# Patient Record
Sex: Female | Born: 1970 | Race: White | Hispanic: No | Marital: Single | Smoking: Never smoker
Health system: Southern US, Community
[De-identification: ages and names within clinical notes are randomized; demographics above are authoritative.]

## PROBLEM LIST (undated history)

## (undated) DIAGNOSIS — T7840XA Allergy, unspecified, initial encounter: Secondary | ICD-10-CM

## (undated) DIAGNOSIS — J302 Other seasonal allergic rhinitis: Secondary | ICD-10-CM

## (undated) DIAGNOSIS — K219 Gastro-esophageal reflux disease without esophagitis: Secondary | ICD-10-CM

## (undated) DIAGNOSIS — J45909 Unspecified asthma, uncomplicated: Secondary | ICD-10-CM

## (undated) DIAGNOSIS — N83209 Unspecified ovarian cyst, unspecified side: Secondary | ICD-10-CM

## (undated) HISTORY — DX: Allergy, unspecified, initial encounter: T78.40XA

## (undated) HISTORY — DX: Gastro-esophageal reflux disease without esophagitis: K21.9

## (undated) HISTORY — DX: Unspecified ovarian cyst, unspecified side: N83.209

---

## 1999-04-23 ENCOUNTER — Other Ambulatory Visit: Admission: RE | Admit: 1999-04-23 | Discharge: 1999-04-23 | Payer: Self-pay | Admitting: Obstetrics and Gynecology

## 2000-08-14 ENCOUNTER — Encounter: Admission: RE | Admit: 2000-08-14 | Discharge: 2000-08-14 | Payer: Self-pay | Admitting: Family Medicine

## 2000-08-14 ENCOUNTER — Encounter: Payer: Self-pay | Admitting: Family Medicine

## 2004-03-22 ENCOUNTER — Other Ambulatory Visit: Admission: RE | Admit: 2004-03-22 | Discharge: 2004-03-22 | Payer: Self-pay | Admitting: Family Medicine

## 2005-07-31 ENCOUNTER — Encounter: Admission: RE | Admit: 2005-07-31 | Discharge: 2005-07-31 | Payer: Self-pay | Admitting: Family Medicine

## 2006-02-07 ENCOUNTER — Other Ambulatory Visit: Admission: RE | Admit: 2006-02-07 | Discharge: 2006-02-07 | Payer: Self-pay | Admitting: Obstetrics & Gynecology

## 2006-06-10 IMAGING — CR DG CHEST 2V
2 series · 2 of 2 positions shown · non-contrast
Comparison: None.

CLINICAL DATA: Dyspnea and cough.  History of asthma. 
 PA AND LATERAL CHEST:

[w chest pa]
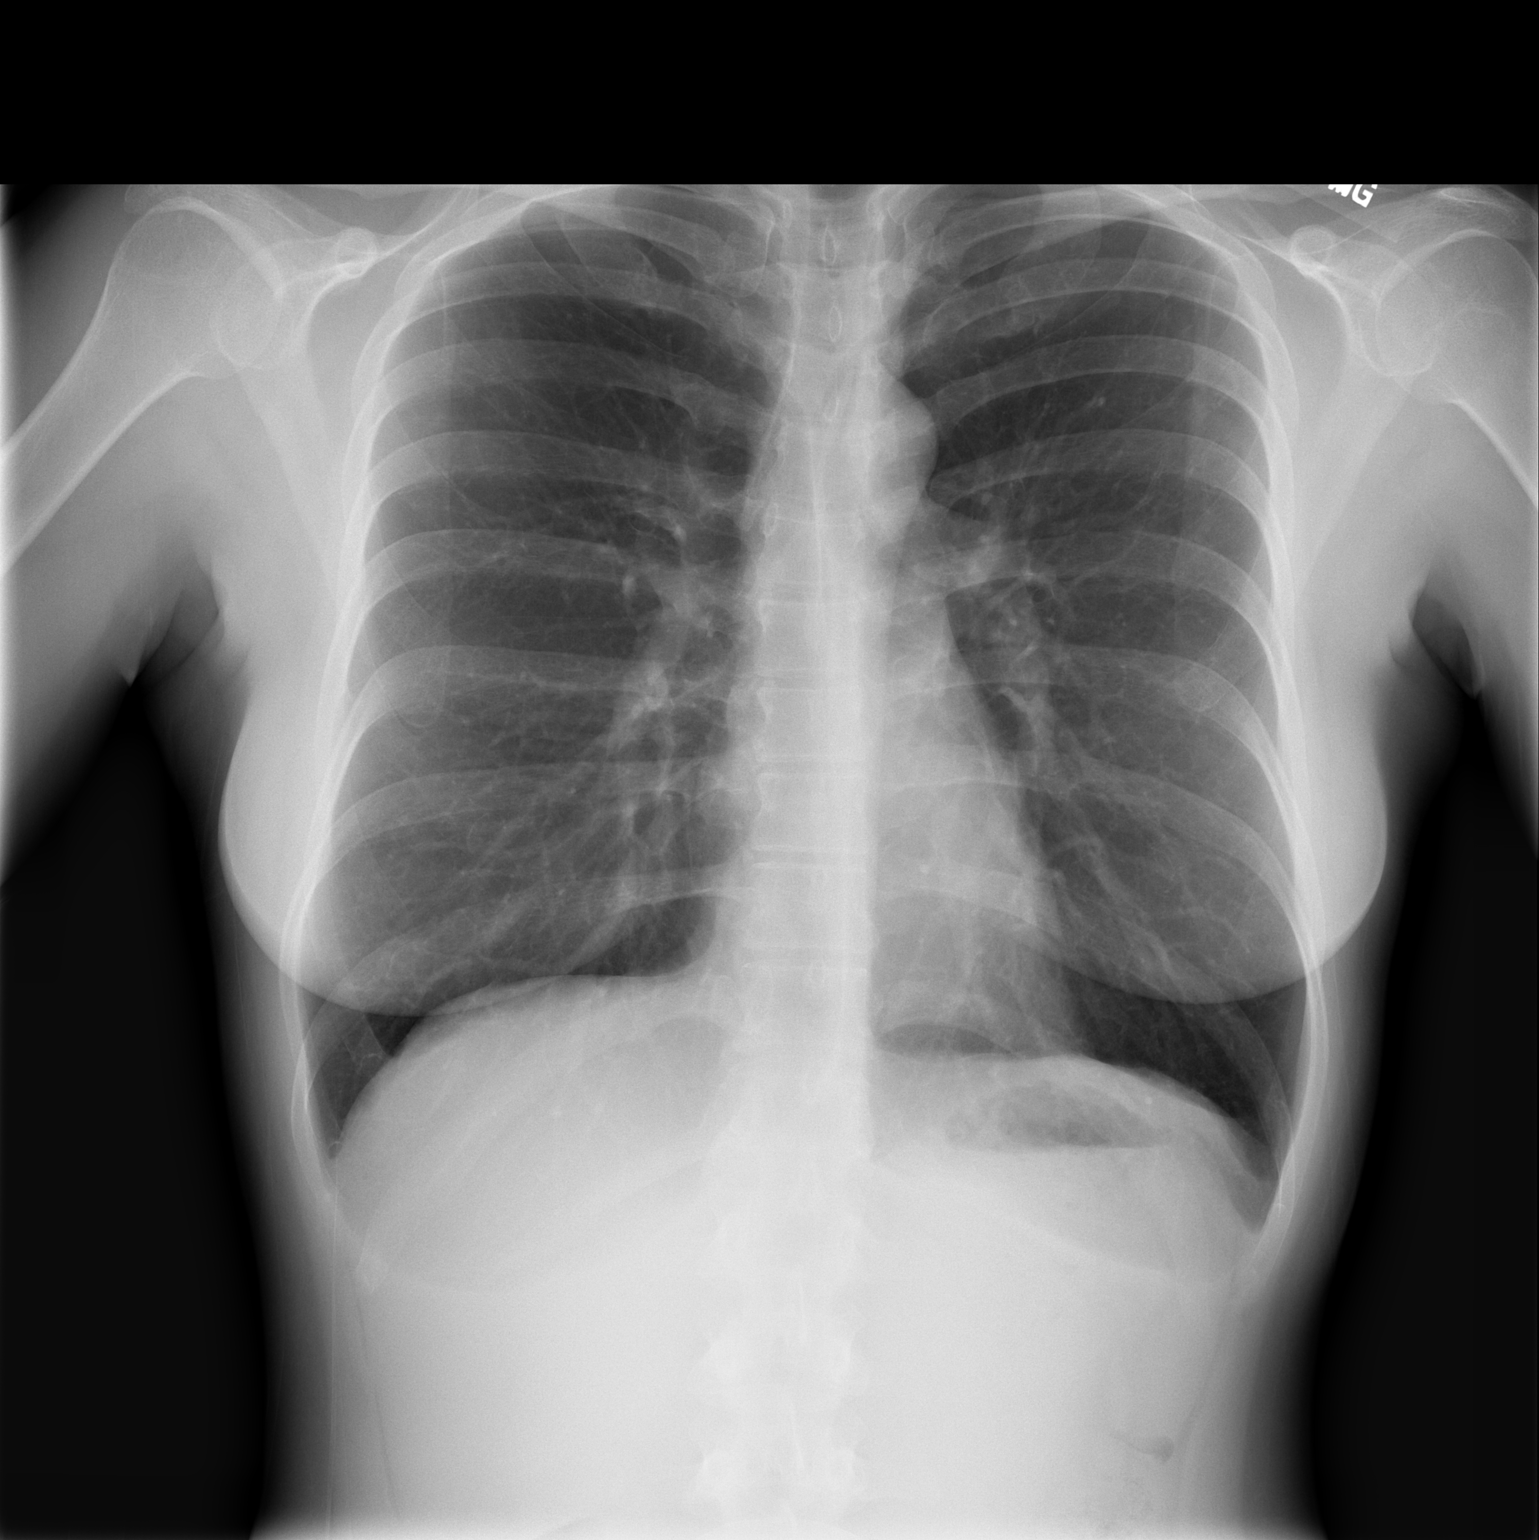

[w chest lat]
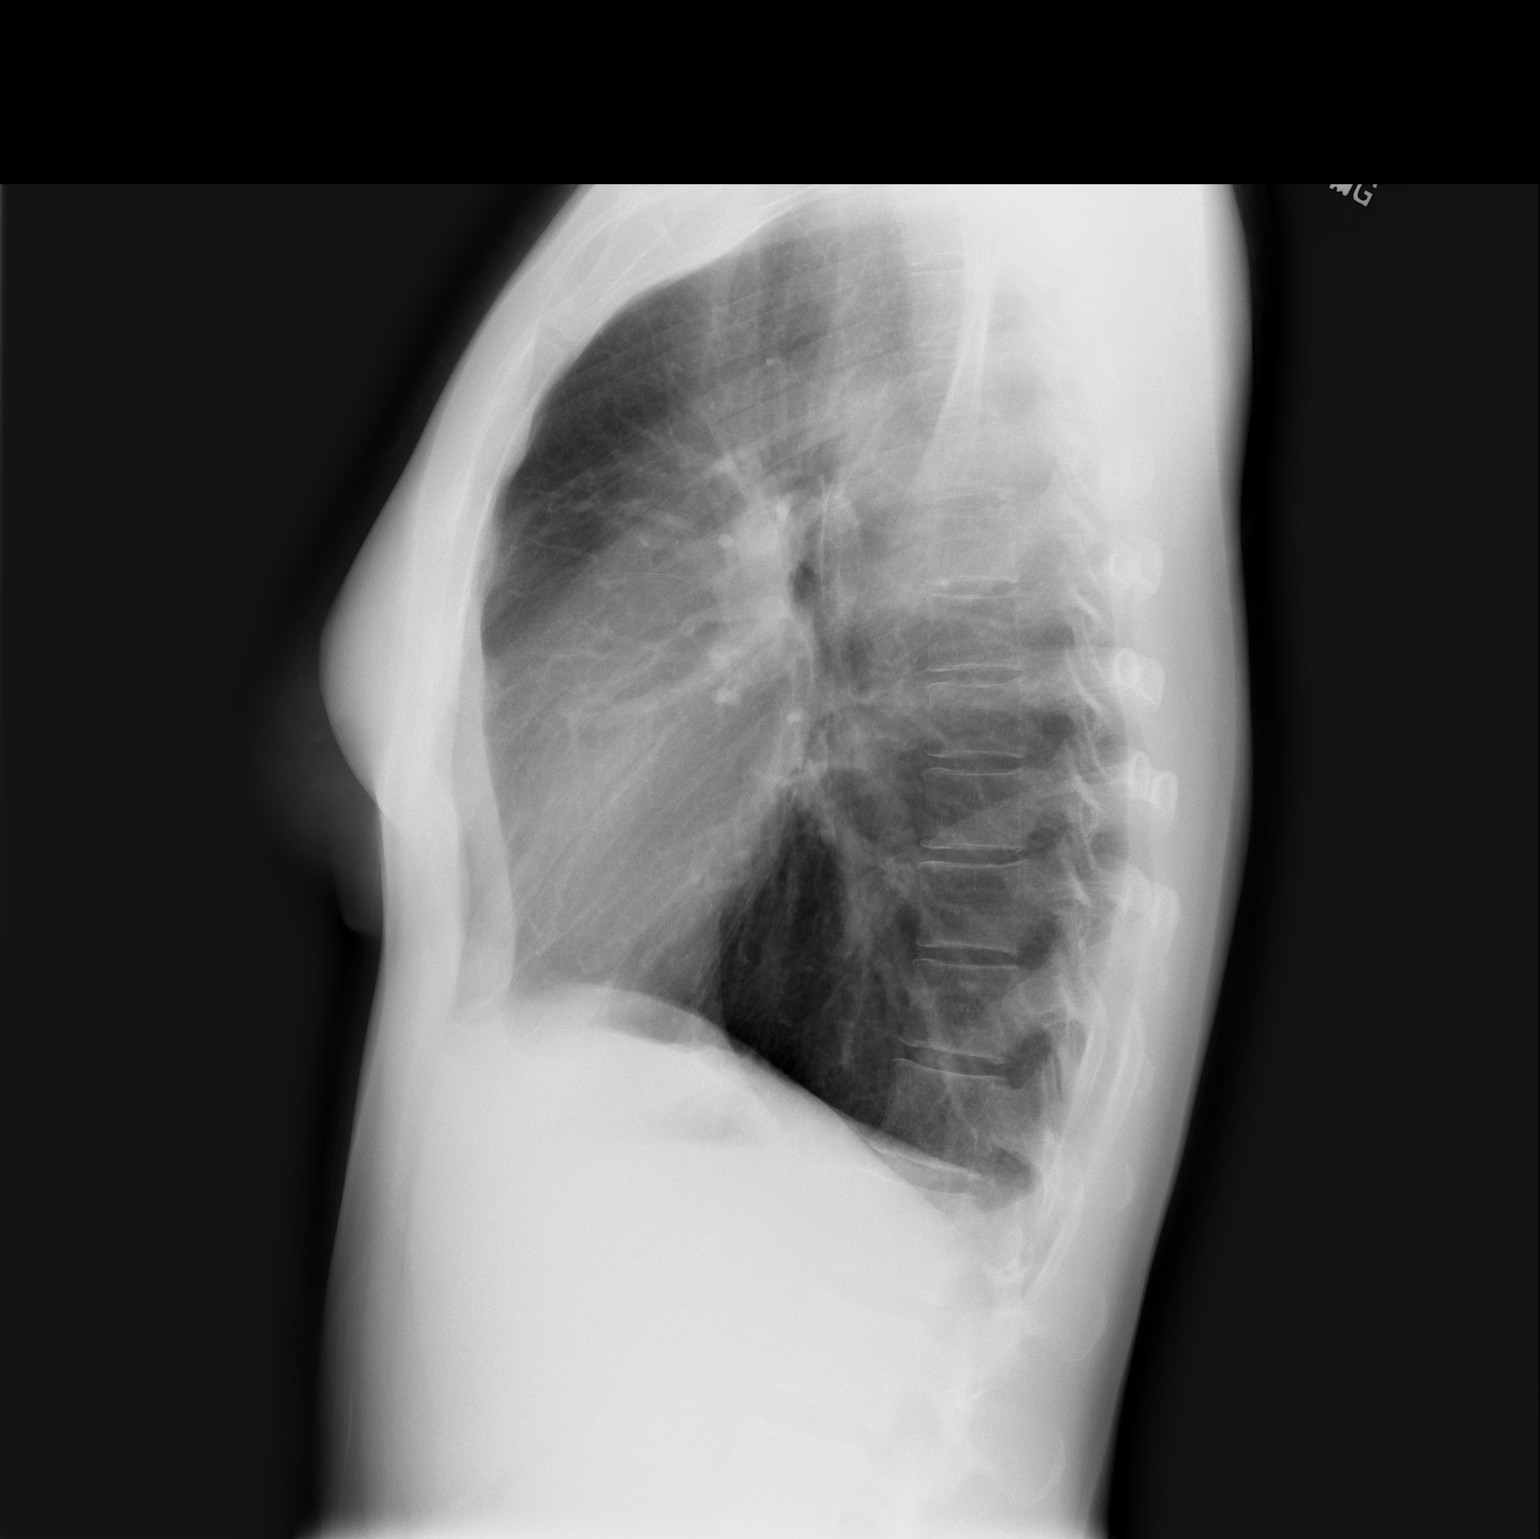

[2 of 2 positions shown; findings below may reference images not displayed]

FINDINGS: The cardiomediastinal contours are normal.  The lungs are clear.  There is no pleural effusion or pneumothorax.  The lungs do appear mildly hyperinflated.
IMPRESSION: Mild hyperinflation.  No acute chest findings.

## 2007-08-20 ENCOUNTER — Encounter: Admission: RE | Admit: 2007-08-20 | Discharge: 2007-08-20 | Payer: Self-pay | Admitting: Family Medicine

## 2007-08-31 ENCOUNTER — Other Ambulatory Visit: Admission: RE | Admit: 2007-08-31 | Discharge: 2007-08-31 | Payer: Self-pay | Admitting: Obstetrics & Gynecology

## 2008-06-29 IMAGING — CR DG CHEST 2V
2 series · 2 of 2 positions shown · non-contrast
Comparison: 07/31/05.

CLINICAL DATA: Enlarged lymph nodes.  History of asthma. 
 PA AND LATERAL CHEST:

[view not recorded (1 of 2)]
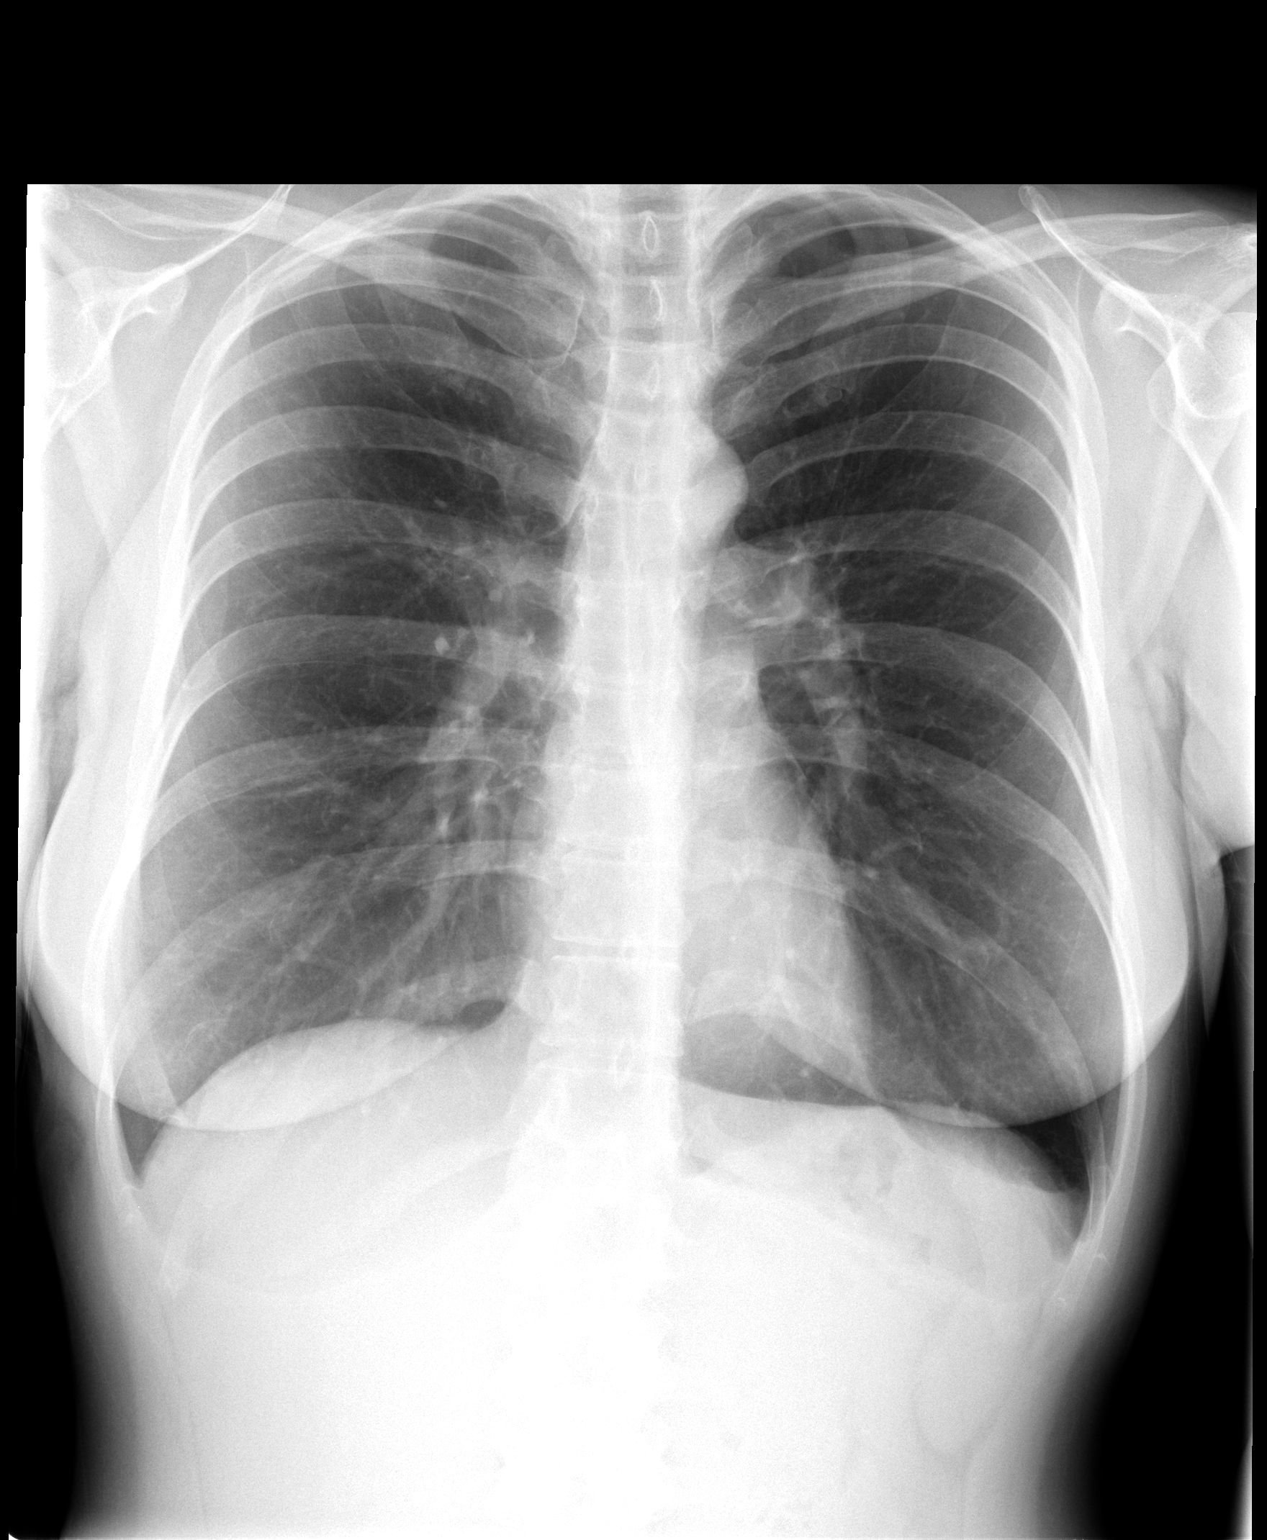

[view not recorded (2 of 2)]
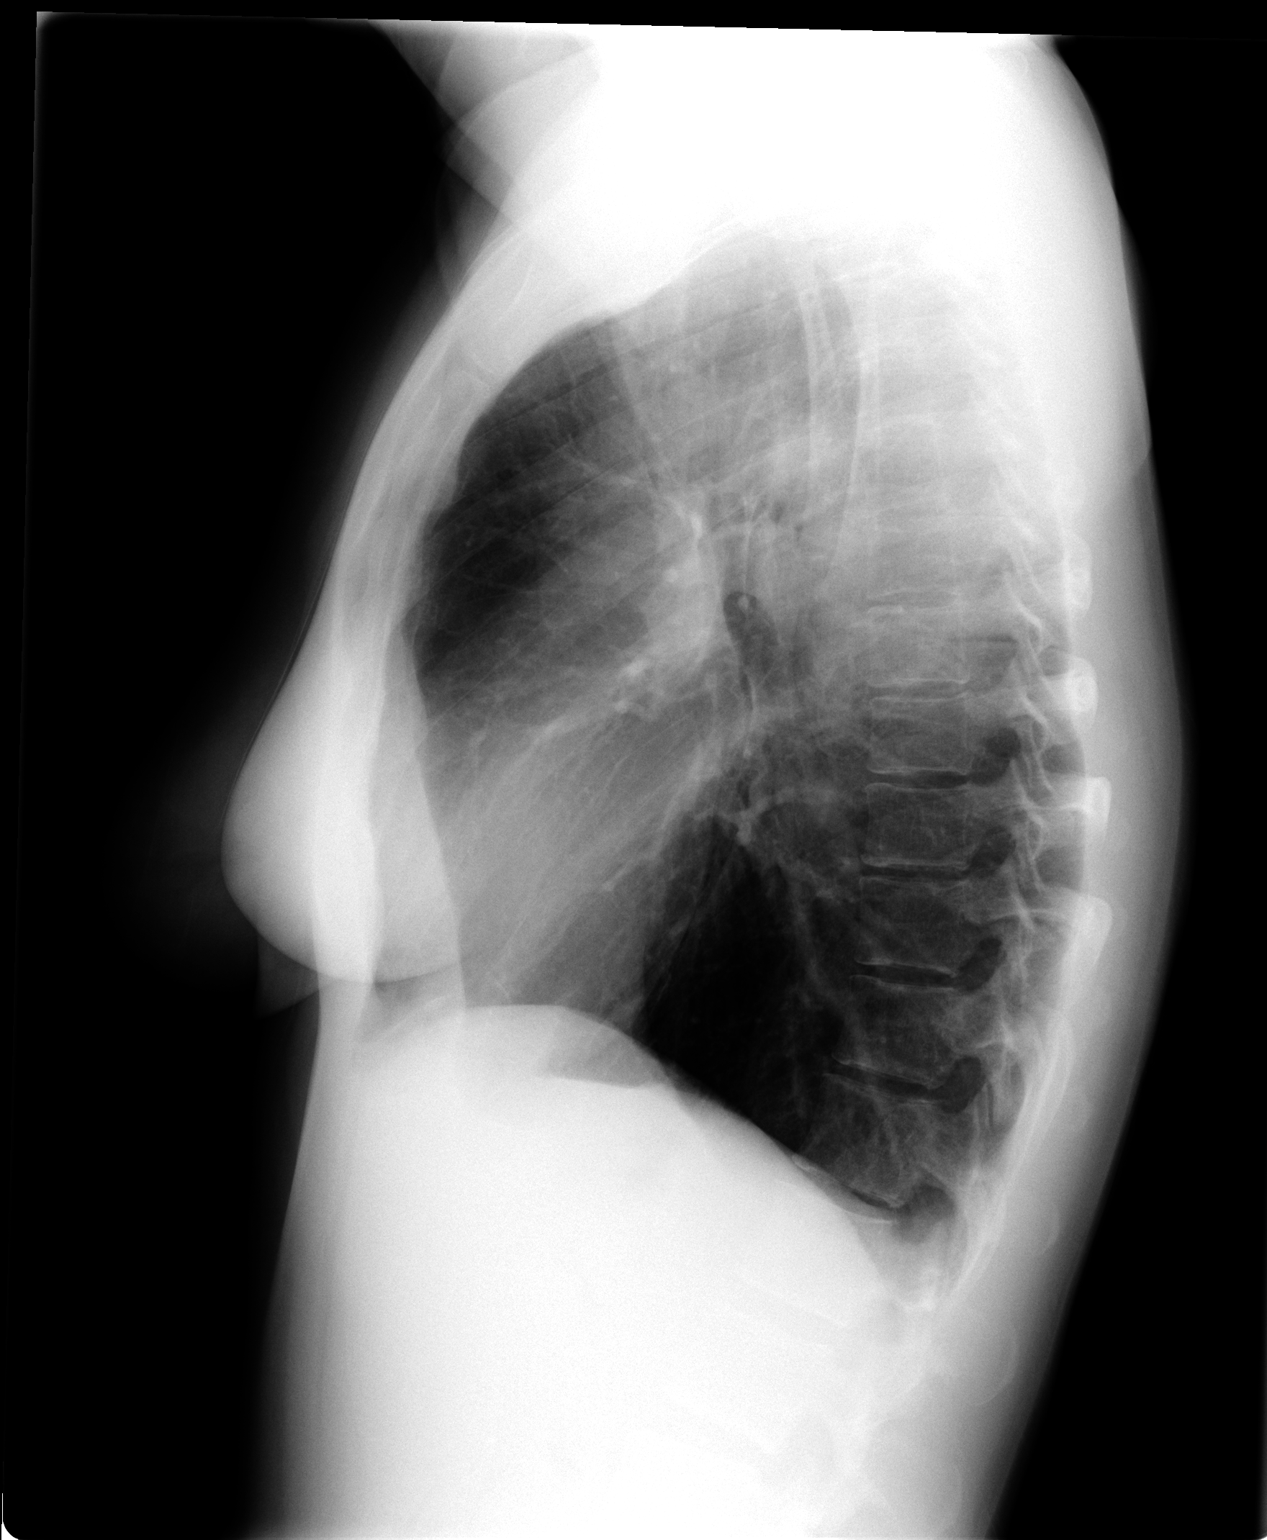

[2 of 2 positions shown; findings below may reference images not displayed]

FINDINGS: The cardiomediastinal contours are stable without evidence of mediastinal or hilar adenopathy. The lungs are clear.  There is no pleural effusion.  Osseous structures appear unchanged.
IMPRESSION: Stable examination.  No active cardiopulmonary process or evidence of adenopathy.

## 2008-06-29 IMAGING — US US PELVIS COMPLETE MODIFY
1 series · 14 of 25 positions shown · non-contrast
Comparison: none

CLINICAL DATA: Irregular periods.  Enlarged pelvic lymph nodes. 
 TRANSABDOMINAL AND TRANSVAGINAL PELVIC ULTRASOUND:
TECHNIQUE: Both transabdominal and transvaginal ultrasound examinations of the pelvis were performed including evaluation of the uterus, ovaries, adnexal regions, and pelvic cul-de-sac.

[Series 1: us pelvis complete modify · 0.22mm/px · 14 of 58 slices shown]
[im 1/58]
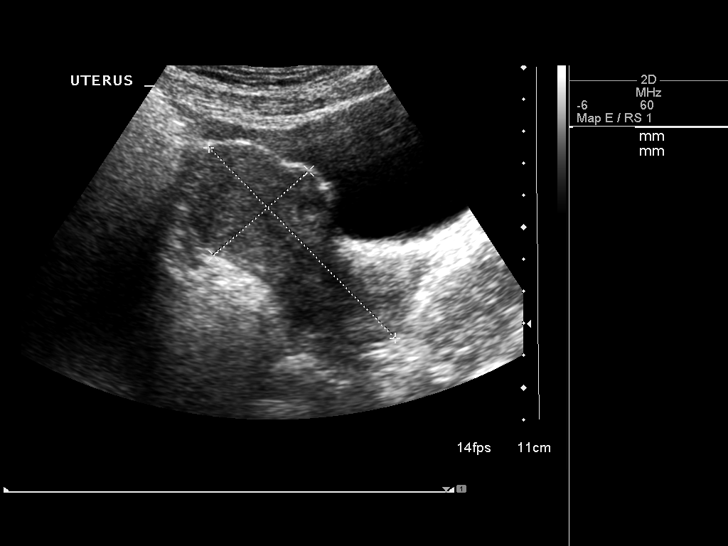
[im 5/58]
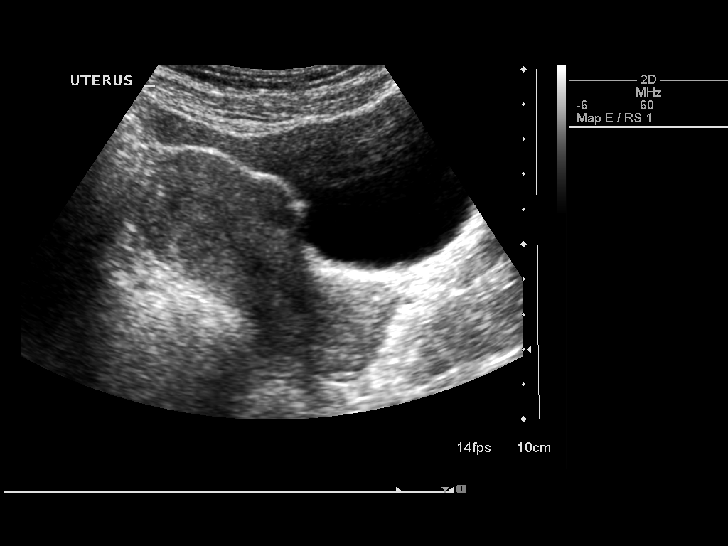
[im 10/58]
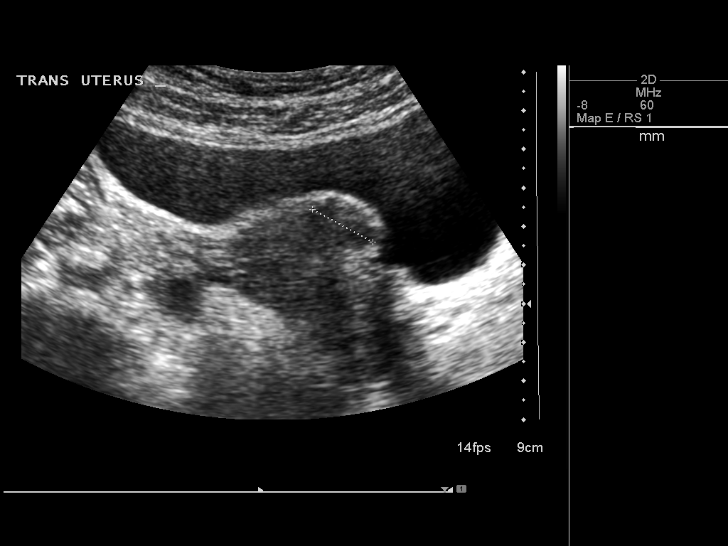
[im 15/58]
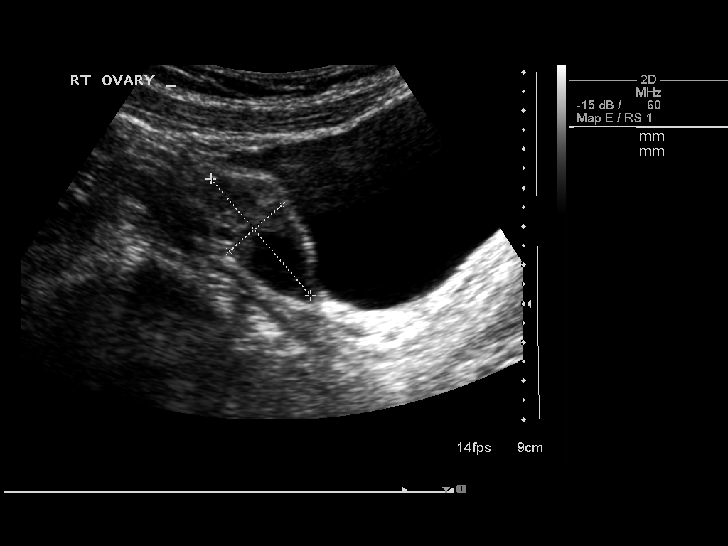
[im 20/58]
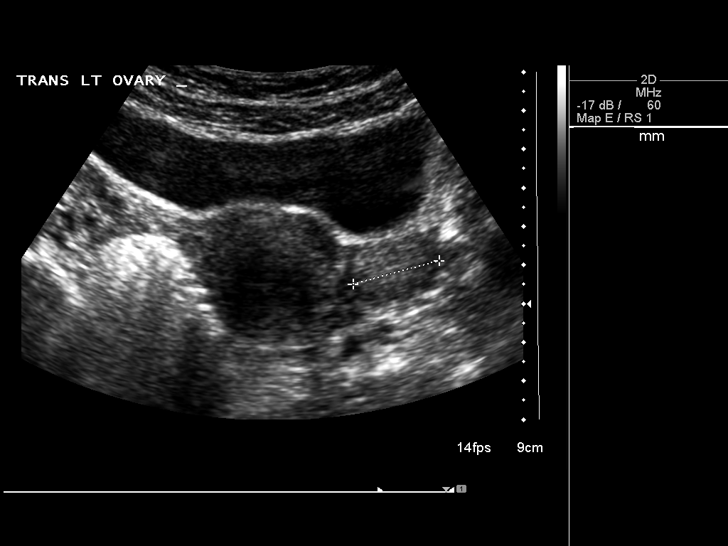
[im 22/58]
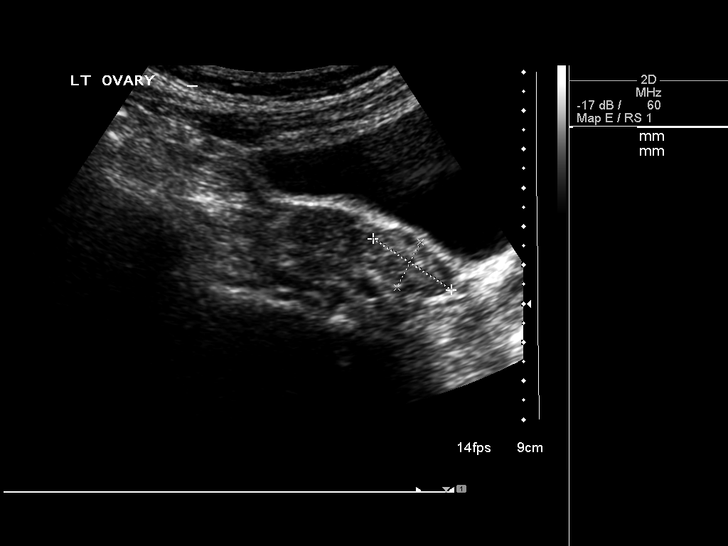
[im 27/58]
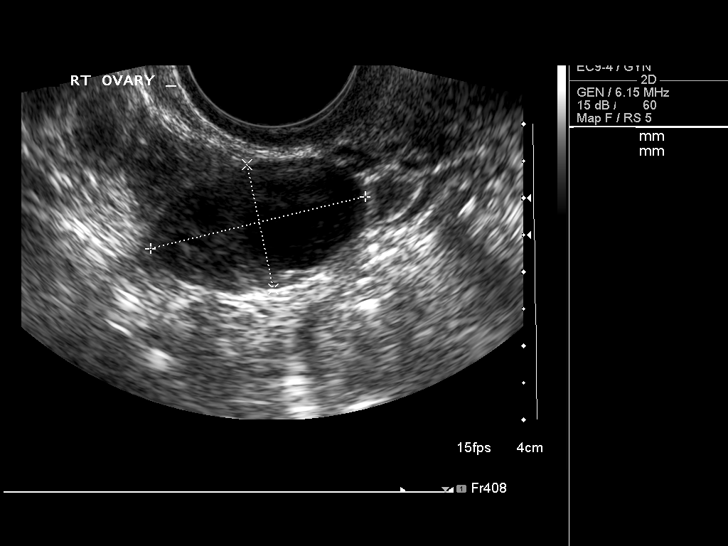
[im 31/58]
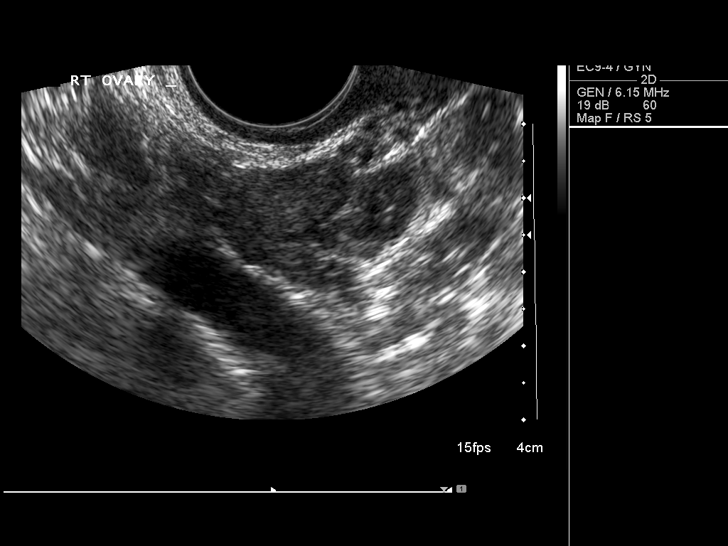
[im 36/58]
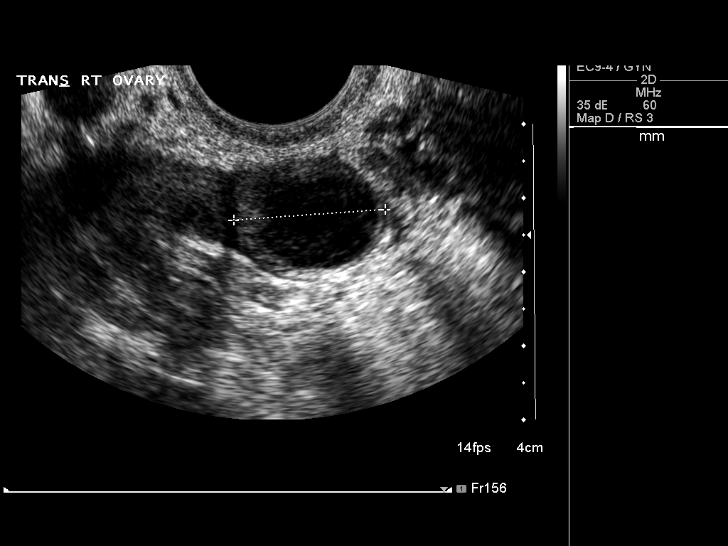
[im 39/58]
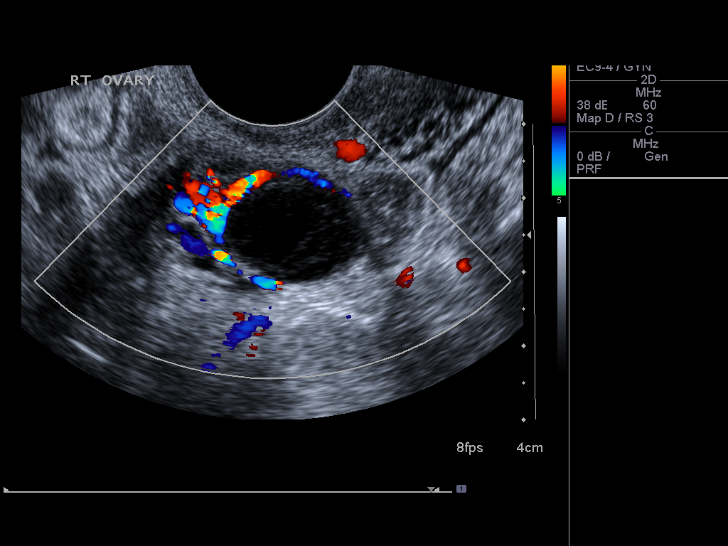
[im 43/58]
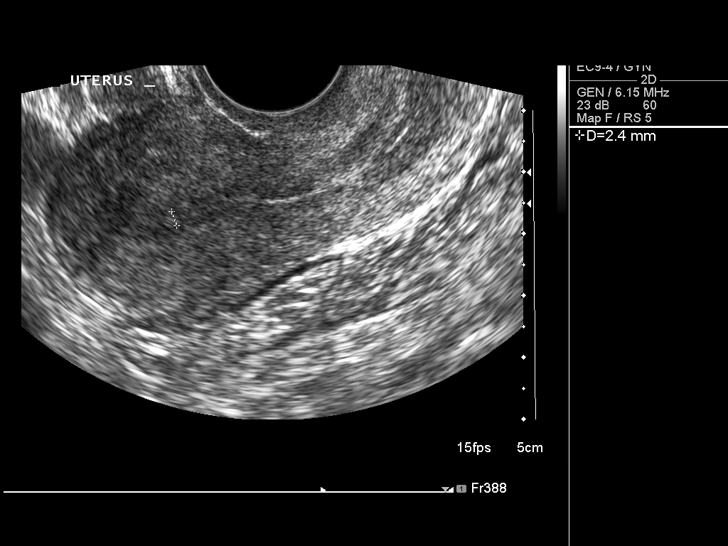
[im 48/58]
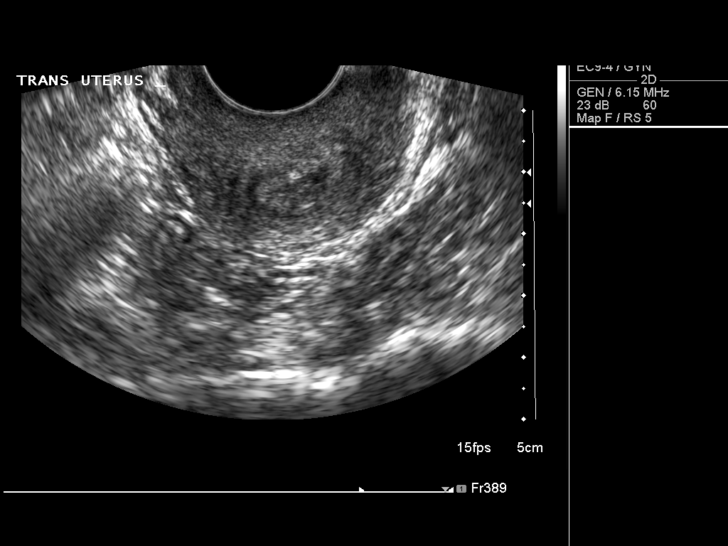
[im 53/58]
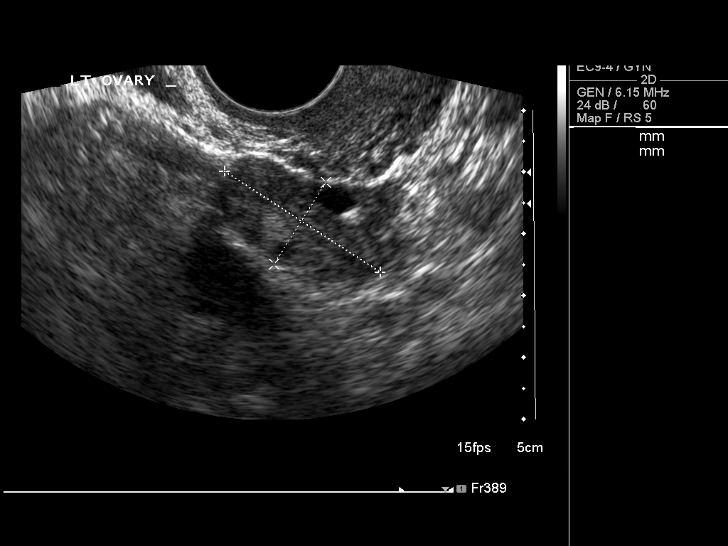
[im 58/58]
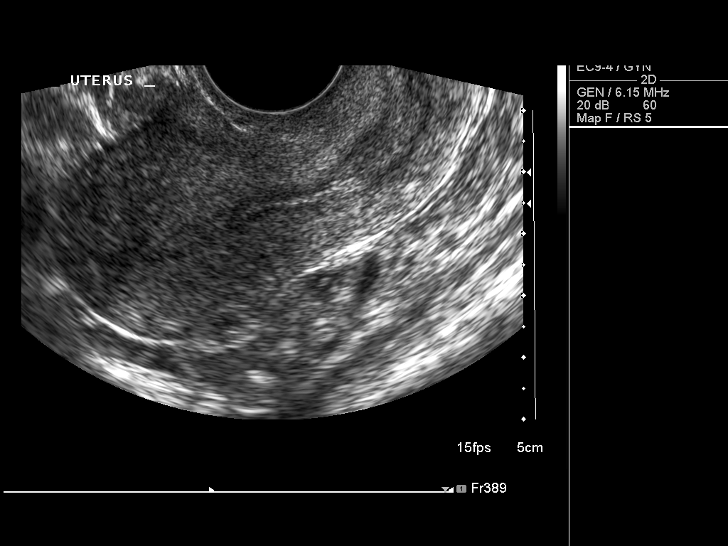

[14 of 25 positions shown; findings below may reference images not displayed]

FINDINGS: The uterus measures 8.3 x 4.0 x 4.7 cm.  There is a 2.1 x 1.3 x 1.8 cm fibroid in the subserosal region anteriorly in the mid portion.  The endometrium is normal at 2.4 mm thick.  The right ovary measures 3 x 1.7 x 4 cm.  There is a 2.3 x 1.6 x 2.1 cm which contains some echogenic material.  This probably represents a small hemorrhagic cyst.  
 The left ovary measures 3 x 1.6 x 2.7 cm and appears normal.  There is no free fluid.
IMPRESSION: 1.  Small subserosal fundal fibroid.  Otherwise normal appearing uterus.
 2.  Ovaries normal except for a 2 cm hemorrhagic cyst on the right.

## 2021-02-25 ENCOUNTER — Ambulatory Visit (INDEPENDENT_AMBULATORY_CARE_PROVIDER_SITE_OTHER): Payer: BLUE CROSS/BLUE SHIELD

## 2021-02-25 ENCOUNTER — Ambulatory Visit (HOSPITAL_COMMUNITY)
Admission: EM | Admit: 2021-02-25 | Discharge: 2021-02-25 | Disposition: A | Discharge status: Home / Self Care | Payer: BLUE CROSS/BLUE SHIELD

## 2021-02-25 ENCOUNTER — Encounter (HOSPITAL_COMMUNITY): Payer: Self-pay

## 2021-02-25 ENCOUNTER — Other Ambulatory Visit: Payer: Self-pay

## 2021-02-25 DIAGNOSIS — R059 Cough, unspecified: Secondary | ICD-10-CM | POA: Diagnosis not present

## 2021-02-25 DIAGNOSIS — J069 Acute upper respiratory infection, unspecified: Secondary | ICD-10-CM | POA: Diagnosis not present

## 2021-02-25 DIAGNOSIS — R0989 Other specified symptoms and signs involving the circulatory and respiratory systems: Secondary | ICD-10-CM

## 2021-02-25 HISTORY — DX: Unspecified asthma, uncomplicated: J45.909

## 2021-02-25 HISTORY — DX: Other seasonal allergic rhinitis: J30.2

## 2021-02-25 MED ORDER — PROMETHAZINE-DM 6.25-15 MG/5ML PO SYRP: 5.0000 mL | ORAL_SOLUTION | Freq: Four times a day (QID) | ORAL | 0 refills | Status: DC | PRN

## 2021-02-25 MED ORDER — BENZONATATE 100 MG PO CAPS: 200.0000 mg | ORAL_CAPSULE | Freq: Three times a day (TID) | ORAL | 0 refills | Status: DC | PRN

## 2021-02-25 MED ORDER — PREDNISONE 20 MG PO TABS: 40.0000 mg | ORAL_TABLET | Freq: Every day | ORAL | 0 refills | Status: AC

## 2021-02-25 NOTE — Discharge Instructions (Addendum)
Keep taking antibiotics as previously prescribed.   Take the prednisone 2 tablets a day for the next 5 days.    You can take [employer] as needed for cough.  You can also take the Promethazine DM at night as needed for cough.  You can take Tylenol and/or Ibuprofen as needed for fever reduction and pain.   For sore throat: try warm salt water gargles, cepacol lozenges, throat spray, warm tea or water with lemon/honey, or OTC cold relief medicine for throat discomfort.    For congestion: take a daily anti-histamine like Zyrtec, Claritin, and a oral decongestant to help with post nasal drip that may be irritating your throat. You can also use Flonase 2 sprays in each nostril daily.    It is important to stay hydrated: drink plenty of fluids (water, gatorade/powerade/pedialyte, juices, or teas) to keep your throat moisturized and help further relieve irritation/discomfort.

## 2021-02-25 NOTE — ED Triage Notes (Addendum)
Pt c/o cough and congestion x 5 days which feels may be allergies. Home covid test negative Friday. Currently on abx for sinus infection and using albuterol, allegra-D and mucinex DM. Adds that she has some tightness in chest but denies pain. States this worsens with cough and deep inspiration.

## 2021-02-25 NOTE — ED Provider Notes (Signed)
[facility]    CSN: [id number] Arrival date & time: 02/25/21  1059      History   Chief Complaint Chief Complaint  Patient presents with  . Cough  . Nasal Congestion  . Chest Pain    HPI Kaylee Cook is a 50 y.o. female.   Patient here for evaluation of cough and congestion that have been ongoing for the past 5 days.  Reports taking at home Covid test that was negative.  Was diagnosed with a sinus infection earlier in the week and is on antibiotics for that.  Also reports taking Allegra, Mucinex, and albuterol inhaler.  Reports history of asthma and seasonal allergies.  Reports cough worse at night and is now having to use albuterol inhaler every 6 hours.  Reports some chest tightness associated with cough and shortness of breath.  Denies any fevers, chest pain, N/V/D, numbness, tingling, weakness, abdominal pain, or headaches.   ROS: As per HPI, all other pertinent ROS negative   The history is provided by the patient.  Cough Associated symptoms: chest pain and shortness of breath   Associated symptoms: no ear pain and no sore throat   Chest Pain Associated symptoms: cough and shortness of breath     Past Medical History:  Diagnosis Date  . Asthma   . Seasonal allergies     There are no problems to display for this patient.   History reviewed. No pertinent surgical history.  OB History   No obstetric history on file.      Home Medications    Prior to Admission medications   Medication Sig Start Date End Date Taking? Authorizing Provider  albuterol (VENTOLIN HFA) 108 (90 Base) MCG/ACT inhaler Inhale into the lungs every 6 (six) hours as needed for wheezing or shortness of breath.   Yes [provider]  benzonatate (TESSALON PERLES) 100 MG capsule Take 2 capsules (200 mg total) by mouth 3 (three) times daily as needed for cough. 02/25/21  Yes Ivette Loyal, NP  fexofenadine-pseudoephedrine (ALLEGRA-D) 60-120 MG 12 hr tablet Take 1 tablet  by mouth daily.   Yes [provider]  predniSONE (DELTASONE) 20 MG tablet Take 2 tablets (40 mg total) by mouth daily for 5 days. 02/25/21 03/02/21 Yes Ivette Loyal, NP  promethazine-dextromethorphan (PROMETHAZINE-DM) 6.25-15 MG/5ML syrup Take 5 mLs by mouth 4 (four) times daily as needed for cough. 02/25/21  Yes Ivette Loyal, NP    Family History Family History  Problem Relation Age of Onset  . Hypertension Mother   . Scoliosis Mother   . Osteoarthritis Mother   . Hypertension Father     Social History Social History   Tobacco Use  . Smoking status: Never Smoker  Substance Use Topics  . Alcohol use: Never  . Drug use: Never     Allergies   Solu-medrol [methylprednisolone]   Review of Systems Review of Systems  HENT: Positive for congestion. Negative for ear discharge, ear pain, sinus pressure, sinus pain and sore throat.   Respiratory: Positive for cough and shortness of breath.   Cardiovascular: Positive for chest pain.  All other systems reviewed and are negative.    Physical Exam Triage Vital Signs ED Triage Vitals  Enc Vitals Group     BP 02/25/21 1141 (!) 145/85     Pulse Rate 02/25/21 1141 (!) 112     Resp 02/25/21 1141 20     Temp 02/25/21 1141 99.4 F (37.4 C)     Temp  src --      SpO2 02/25/21 1141 98 %     Weight --      Height --      Head Circumference --      Peak Flow --      Pain Score 02/25/21 1134 0     Pain Loc --      Pain Edu? --      Excl. in GC? --    No data found.  Updated Vital Signs BP (!) 145/85   Pulse (!) 112   Temp 99.4 F (37.4 C)   Resp 20   SpO2 98%   Visual Acuity Right Eye Distance:   Left Eye Distance:   Bilateral Distance:    Right Eye Near:   Left Eye Near:    Bilateral Near:     Physical Exam Vitals and nursing note reviewed.  Constitutional:      General: She is not in acute distress.    Appearance: Normal appearance. She is not ill-appearing, toxic-appearing or diaphoretic.  HENT:      Head: Normocephalic and atraumatic.  Eyes:     Conjunctiva/sclera: Conjunctivae normal.     Pupils: Pupils are equal, round, and reactive to light.  Cardiovascular:     Rate and Rhythm: Regular rhythm. Tachycardia present.     Pulses: Normal pulses.     Heart sounds: Normal heart sounds.  Pulmonary:     Effort: Pulmonary effort is normal.     Breath sounds: Examination of the left-lower field reveals wheezing. Wheezing present. No rhonchi or rales.  Abdominal:     General: Abdomen is flat.  Musculoskeletal:        General: Normal range of motion.     Cervical back: Normal range of motion.  Skin:    General: Skin is warm and dry.  Neurological:     General: No focal deficit present.     Mental Status: She is alert and oriented to person, place, and time.  Psychiatric:        Mood and Affect: Mood normal.      UC Treatments / Results  Labs (all labs ordered are listed, but only abnormal results are displayed) Labs Reviewed - No data to display  EKG   Radiology DG Chest 2 View  Result Date: 02/25/2021 CLINICAL DATA:  cough and congestion x 5 days EXAM: CHEST - 2 VIEW COMPARISON:  Chest radiograph 08/20/2007 FINDINGS: The cardiomediastinal contours are within normal limits. The lungs are clear. No pneumothorax or pleural effusion. No acute finding in the visualized skeleton. IMPRESSION: No active cardiopulmonary disease. Electronically Signed   By: Emmaline Kluver M.D.   On: 02/25/2021 12:40    Procedures Procedures (including critical care time)  Medications Ordered in UC Medications - No data to display  Initial Impression / Assessment and Plan / UC Course  I have reviewed the triage vital signs and the nursing notes.  Pertinent labs & imaging results that were available during my care of the patient were reviewed by me and considered in my medical decision making (see chart for details).     Assessment negative for red flags or concerns, possibly viral URI  with cough or asthma flare.Cathlean Sauer with no active cardiopulmonary disease.  Patient can continue taking antibiotics as previously prescribed.  Prednisone burst over the next 5 days.  Tessalon Perles and Promethazine DM prescribed as needed for cough.  Continue daily medications as prescribed.  Discussed symptom management as described below  and discharge instructions.  Follow-up with primary care as needed.   Final Clinical Impressions(s) / UC Diagnoses   Final diagnoses:  Viral URI with cough     Discharge Instructions     Keep taking antibiotics as previously prescribed.   Take the prednisone 2 tablets a day for the next 5 days.    You can take [employer] as needed for cough.  You can also take the Promethazine DM at night as needed for cough.  You can take Tylenol and/or Ibuprofen as needed for fever reduction and pain.   For sore throat: try warm salt water gargles, cepacol lozenges, throat spray, warm tea or water with lemon/honey, or OTC cold relief medicine for throat discomfort.    For congestion: take a daily anti-histamine like Zyrtec, Claritin, and a oral decongestant to help with post nasal drip that may be irritating your throat. You can also use Flonase 2 sprays in each nostril daily.    It is important to stay hydrated: drink plenty of fluids (water, gatorade/powerade/pedialyte, juices, or teas) to keep your throat moisturized and help further relieve irritation/discomfort.      ED Prescriptions    Medication Sig Dispense Auth. Provider   predniSONE (DELTASONE) 20 MG tablet Take 2 tablets (40 mg total) by mouth daily for 5 days. 10 tablet Ivette Loyal, NP   benzonatate (TESSALON PERLES) 100 MG capsule Take 2 capsules (200 mg total) by mouth 3 (three) times daily as needed for cough. 20 capsule Ivette Loyal, NP   promethazine-dextromethorphan (PROMETHAZINE-DM) 6.25-15 MG/5ML syrup Take 5 mLs by mouth 4 (four) times daily as needed for cough. 118 mL Ivette Loyal, NP     PDMP not reviewed this encounter.   Ivette Loyal, NP 02/25/21 1347

## 2022-04-24 ENCOUNTER — Ambulatory Visit: Payer: Self-pay

## 2022-04-24 ENCOUNTER — Encounter: Payer: Self-pay | Admitting: Emergency Medicine

## 2022-04-24 ENCOUNTER — Ambulatory Visit
Admission: EM | Admit: 2022-04-24 | Discharge: 2022-04-24 | Disposition: A | Discharge status: Home / Self Care | Payer: BC Managed Care – PPO | Attending: Urgent Care | Admitting: Urgent Care

## 2022-04-24 DIAGNOSIS — J309 Allergic rhinitis, unspecified: Secondary | ICD-10-CM

## 2022-04-24 DIAGNOSIS — H6983 Other specified disorders of Eustachian tube, bilateral: Secondary | ICD-10-CM | POA: Diagnosis not present

## 2022-04-24 MED ORDER — MECLIZINE HCL 12.5 MG PO TABS: 12.5000 mg | ORAL_TABLET | Freq: Three times a day (TID) | ORAL | 0 refills | Status: AC | PRN

## 2022-04-24 MED ORDER — LEVOCETIRIZINE DIHYDROCHLORIDE 5 MG PO TABS: 5.0000 mg | ORAL_TABLET | Freq: Every evening | ORAL | 3 refills | Status: DC

## 2022-04-24 MED ORDER — ONDANSETRON 8 MG PO TBDP: 8.0000 mg | ORAL_TABLET | Freq: Three times a day (TID) | ORAL | 0 refills | Status: DC | PRN

## 2022-04-24 MED ORDER — AMOXICILLIN 875 MG PO TABS: 875.0000 mg | ORAL_TABLET | Freq: Two times a day (BID) | ORAL | 0 refills | Status: DC

## 2022-04-24 MED ORDER — PREDNISONE 50 MG PO TABS: 50.0000 mg | ORAL_TABLET | Freq: Every day | ORAL | 0 refills | Status: DC

## 2022-04-24 NOTE — ED Provider Notes (Signed)
[facility]   MRN: [id number] DOB: 1971-05-28  Subjective:   Kaylee Cook is a 51 y.o. female presenting for 1 day history of acute onset sinus congestion, sinus pain, sinus headaches, nausea, vomiting with vertigo.  Symptoms initially started yesterday with nausea and vomiting but has persisted into today with the symptoms that she has normally with her allergies.  However this feels like one of the more difficult allergy flares that she has had.  She has been traveling in the past week and then did a lot of yard work over the weekend.  She does take Allegra-D regularly and has for years.  Has a history of asthma but does not feel that this is affecting her now.  No confusion, vision changes, weakness, numbness or tingling.  No history of stroke or neurologic disorders.  No current facility-administered medications for this encounter.  Current Outpatient Medications:    albuterol (VENTOLIN HFA) 108 (90 Base) MCG/ACT inhaler, Inhale into the lungs every 6 (six) hours as needed for wheezing or shortness of breath., Disp: , Rfl:    benzonatate (TESSALON PERLES) 100 MG capsule, Take 2 capsules (200 mg total) by mouth 3 (three) times daily as needed for cough., Disp: 20 capsule, Rfl: 0   fexofenadine-pseudoephedrine (ALLEGRA-D) 60-120 MG 12 hr tablet, Take 1 tablet by mouth daily., Disp: , Rfl:    promethazine-dextromethorphan (PROMETHAZINE-DM) 6.25-15 MG/5ML syrup, Take 5 mLs by mouth 4 (four) times daily as needed for cough., Disp: 118 mL, Rfl: 0   No Active Allergies  Past Medical History:  Diagnosis Date   Asthma    Seasonal allergies      History reviewed. No pertinent surgical history.  Family History  Problem Relation Age of Onset   Hypertension Mother    Scoliosis Mother    Osteoarthritis Mother    Hypertension Father     Social History   Tobacco Use   Smoking status: Never  Substance Use Topics   Alcohol use: Never   Drug use: Never     ROS   Objective:   Vitals: BP 132/86   Pulse 86   Temp 98 F (36.7 C)   Resp 20   SpO2 98%   Physical Exam Constitutional:      General: She is not in acute distress.    Appearance: Normal appearance. She is well-developed and normal weight. She is not ill-appearing, toxic-appearing or diaphoretic.  HENT:     Head: Normocephalic and atraumatic.     Right Ear: Tympanic membrane, ear canal and external ear normal. No drainage or tenderness. No middle ear effusion. There is no impacted cerumen. Tympanic membrane is not erythematous.     Left Ear: Tympanic membrane, ear canal and external ear normal. No drainage or tenderness.  No middle ear effusion. There is no impacted cerumen. Tympanic membrane is not erythematous.     Nose: Nose normal. No congestion or rhinorrhea.     Mouth/Throat:     Mouth: Mucous membranes are moist. No oral lesions.     Pharynx: No pharyngeal swelling, oropharyngeal exudate, posterior oropharyngeal erythema or uvula swelling.     Tonsils: No tonsillar exudate or tonsillar abscesses.  Eyes:     General: No scleral icterus.       Right eye: No discharge.        Left eye: No discharge.     Extraocular Movements: Extraocular movements intact.     Right eye: Normal extraocular motion.     Left  eye: Normal extraocular motion.     Conjunctiva/sclera: Conjunctivae normal.  Cardiovascular:     Rate and Rhythm: Normal rate.  Pulmonary:     Effort: Pulmonary effort is normal.  Musculoskeletal:     Cervical back: Normal range of motion and neck supple.  Lymphadenopathy:     Cervical: No cervical adenopathy.  Skin:    General: Skin is warm and dry.  Neurological:     General: No focal deficit present.     Mental Status: She is alert and oriented to person, place, and time.     Cranial Nerves: No cranial nerve deficit, dysarthria or facial asymmetry.     Motor: No weakness.     Coordination: Coordination normal.     Gait: Gait normal.     Deep Tendon  Reflexes: Reflexes normal.  Psychiatric:        Mood and Affect: Mood normal. Mood is not anxious or depressed.        Speech: Speech normal.        Behavior: Behavior normal. Behavior is not agitated.        Cognition and Memory: Cognition is not impaired. Memory is not impaired.     Assessment and Plan :   PDMP not reviewed this encounter.  1. Eustachian tube dysfunction, bilateral   2. Allergic rhinitis, unspecified seasonality, unspecified trigger    Low suspicion for an acute encephalopathy.  I suspect the primary problem is eustachian tube dysfunction secondary to allergic rhinitis.  Recommended aggressive management with an oral prednisone course.  Switch from Allegra-D to Xyzal.  For symptomatic relief use meclizine and Zofran.  Will defer antibiotic use for now.  Counseled patient on potential for adverse effects with medications prescribed/recommended today, ER and return-to-clinic precautions discussed, patient verbalized understanding.    Wallis Bamberg, [place] 04/24/22 1549

## 2022-04-24 NOTE — ED Triage Notes (Signed)
Pt here with congestion, headache, N/V and vertigo starting last night. Last episode of emesis was today about an hour ago.

## 2024-12-10 ENCOUNTER — Ambulatory Visit: Payer: Self-pay | Admitting: Nurse Practitioner

## 2024-12-10 ENCOUNTER — Encounter: Payer: Self-pay | Admitting: Nurse Practitioner

## 2024-12-10 ENCOUNTER — Ambulatory Visit: Admitting: Nurse Practitioner

## 2024-12-10 VITALS — BP 122/78 | HR 88 | Temp 97.5°F | Ht 62.0 in | Wt 160.2 lb

## 2024-12-10 DIAGNOSIS — Z136 Encounter for screening for cardiovascular disorders: Secondary | ICD-10-CM | POA: Diagnosis not present

## 2024-12-10 DIAGNOSIS — J3089 Other allergic rhinitis: Secondary | ICD-10-CM | POA: Insufficient documentation

## 2024-12-10 DIAGNOSIS — G5601 Carpal tunnel syndrome, right upper limb: Secondary | ICD-10-CM | POA: Diagnosis not present

## 2024-12-10 DIAGNOSIS — J329 Chronic sinusitis, unspecified: Secondary | ICD-10-CM | POA: Insufficient documentation

## 2024-12-10 DIAGNOSIS — J4520 Mild intermittent asthma, uncomplicated: Secondary | ICD-10-CM | POA: Diagnosis not present

## 2024-12-10 DIAGNOSIS — R5383 Other fatigue: Secondary | ICD-10-CM

## 2024-12-10 DIAGNOSIS — Z1231 Encounter for screening mammogram for malignant neoplasm of breast: Secondary | ICD-10-CM

## 2024-12-10 DIAGNOSIS — Z23 Encounter for immunization: Secondary | ICD-10-CM

## 2024-12-10 DIAGNOSIS — K219 Gastro-esophageal reflux disease without esophagitis: Secondary | ICD-10-CM | POA: Diagnosis not present

## 2024-12-10 LAB — LIPID PANEL
Cholesterol: 229 mg/dL — ABNORMAL HIGH (ref 28–200)
HDL: 72.1 mg/dL
LDL Cholesterol: 141 mg/dL — ABNORMAL HIGH (ref 10–99)
NonHDL: 156.63
Total CHOL/HDL Ratio: 3
Triglycerides: 80 mg/dL (ref 10.0–149.0)
VLDL: 16 mg/dL (ref 0.0–40.0)

## 2024-12-10 LAB — CBC WITH DIFFERENTIAL/PLATELET
Basophils Absolute: 0.1 10*3/uL (ref 0.0–0.1)
Basophils Relative: 1 % (ref 0.0–3.0)
Eosinophils Absolute: 0.1 10*3/uL (ref 0.0–0.7)
Eosinophils Relative: 1.7 % (ref 0.0–5.0)
HCT: 37.6 % (ref 36.0–46.0)
Hemoglobin: 12.7 g/dL (ref 12.0–15.0)
Lymphocytes Relative: 24.9 % (ref 12.0–46.0)
Lymphs Abs: 1.5 10*3/uL (ref 0.7–4.0)
MCHC: 33.8 g/dL (ref 30.0–36.0)
MCV: 89.4 fl (ref 78.0–100.0)
Monocytes Absolute: 0.5 10*3/uL (ref 0.1–1.0)
Monocytes Relative: 7.6 % (ref 3.0–12.0)
Neutro Abs: 4 10*3/uL (ref 1.4–7.7)
Neutrophils Relative %: 64.8 % (ref 43.0–77.0)
Platelets: 239 10*3/uL (ref 150.0–400.0)
RBC: 4.21 Mil/uL (ref 3.87–5.11)
RDW: 14.2 % (ref 11.5–15.5)
WBC: 6.2 10*3/uL (ref 4.0–10.5)

## 2024-12-10 LAB — VITAMIN D 25 HYDROXY (VIT D DEFICIENCY, FRACTURES): VITD: 20.08 ng/mL — ABNORMAL LOW (ref 30.00–100.00)

## 2024-12-10 LAB — VITAMIN B12: Vitamin B-12: 225 pg/mL (ref 211–911)

## 2024-12-10 LAB — COMPREHENSIVE METABOLIC PANEL WITH GFR
ALT: 14 U/L (ref 3–35)
AST: 15 U/L (ref 5–37)
Albumin: 4.4 g/dL (ref 3.5–5.2)
Alkaline Phosphatase: 73 U/L (ref 39–117)
BUN: 15 mg/dL (ref 6–23)
CO2: 27 meq/L (ref 19–32)
Calcium: 9.9 mg/dL (ref 8.4–10.5)
Chloride: 103 meq/L (ref 96–112)
Creatinine, Ser: 0.87 mg/dL (ref 0.40–1.20)
GFR: 75.06 mL/min
Glucose, Bld: 87 mg/dL (ref 70–99)
Potassium: 4.2 meq/L (ref 3.5–5.1)
Sodium: 138 meq/L (ref 135–145)
Total Bilirubin: 0.5 mg/dL (ref 0.2–1.2)
Total Protein: 7.4 g/dL (ref 6.0–8.3)

## 2024-12-10 LAB — TSH: TSH: 2.87 u[IU]/mL (ref 0.35–5.50)

## 2024-12-10 NOTE — Assessment & Plan Note (Signed)
 Chronic, stable. Her GERD is managed with zegerid and dietary modifications, and symptoms are well-controlled. Continue current regimen.

## 2024-12-10 NOTE — Assessment & Plan Note (Signed)
 Chronic, stable. Continue loratadine 10mg  daily, can alternate with zyrtec or xyzal  if needed. Recommend adding nasal sinus rinses daily.

## 2024-12-10 NOTE — Patient Instructions (Signed)
 It was great to see you!  You are scheduled for mammogram on 12/27/24 at 2:10pm at our office.   We are checking your labs today and will let you know the results via mychart/phone.   I have placed a referral to ortho for your carpal tunnel, they will call   Let's follow-up in 2-3 months, sooner if you have concerns.  If a referral was placed today, you will be contacted for an appointment. Please note that routine referrals can sometimes take up to 3-4 weeks to process. Please call our office if you haven't heard anything after this time frame.  Take care,  Tinnie Harada, NP

## 2024-12-10 NOTE — Assessment & Plan Note (Signed)
 Chronic, stable. 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 She rarely uses albuterol and has had no recent exacerbations. Continue loratadine and albuterol inhaler as needed. 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 She is overdue for multiple vaccines and will discuss more next visit.

## 2024-12-10 NOTE — Progress Notes (Signed)
 "  New Patient Visit  BP 122/78 (BP Location: Left Arm, Patient Position: Sitting, Cuff Size: Normal)   Pulse 88   Temp (!) 97.5 F (36.4 C)   Ht 5' 2 (1.575 m)   Wt 160 lb 3.2 oz (72.7 kg)   SpO2 99%   BMI 29.30 kg/m    Subjective:    Patient ID: Kaylee Cook, female    DOB: 12/19/1970, 54 y.o.   MRN: [id number]  CC: Chief Complaint  Patient presents with   Establish Care    NP. Est. Care, concerns with recurrent sinus infections, carpal tunnel right wrist, request non-fasting labs, Flu Vaccine    HPI: Kaylee Cook is a 54 y.o. female presents for new patient visit to establish care. Introduced to publishing rights manager role and practice setting. All questions answered. Discussed provider/patient relationship and expectations.  Discussed the use of AI scribe software for clinical note transcription with the patient, who gave verbal consent to proceed.  She has recurrent sinus infections about three times a year, often with seasonal and allergy changes, described as feeling like rocks in her sinuses. These episodes are associated with vertigo about twice a year. She has been experiencing current congestion and sinus pain for 2 weeks, but does not feel it is infection at this time.   She has allergy-induced asthma controlled with loratadine. She uses an albuterol inhaler rarely, about once every three months. She has had skin testing and is broadly allergic to environmental allergens. She sometimes adds Sudafed to loratadine for symptoms.  Her GERD is treated with omeprazole and bicarb (Zegerid), which works well in addition to limiting triggers.   She has carpal tunnel in the right hand that has worsened over the past year. Prior physical therapy and laser treatment gave partial relief. She now notes more frequent dropping of objects and numbness in the thumb and index finger. A night brace and compression glove improve symptoms.  She is postmenopausal, about seven years, with  fatigue, brain fog, occasional mood swings, trouble sleeping. She denies chest pain, shortness of breath, vision changes, gastrointestinal upset beyond known GERD, skin changes, urinary symptoms, or significant anxiety or depression.     Depression and Anxiety Screen done:     12/10/2024    8:35 AM  Depression screen PHQ 2/9  Decreased Interest 0  Down, Depressed, Hopeless 0  PHQ - 2 Score 0  Altered sleeping 1  Tired, decreased energy 1  Change in appetite 0  Feeling bad or failure about yourself  0  Trouble concentrating 0  Moving slowly or fidgety/restless 0  Suicidal thoughts 0  PHQ-9 Score 2  Difficult doing work/chores Not difficult at all      12/10/2024    8:35 AM  GAD 7 : Generalized Anxiety Score  Nervous, Anxious, on Edge 0  Control/stop worrying 0  Worry too much - different things 0  Trouble relaxing 1  Restless 0  Easily annoyed or irritable 0  Afraid - awful might happen 0  Total GAD 7 Score 1  Anxiety Difficulty Not difficult at all    Past Medical History:  Diagnosis Date   Allergy    Asthma    GERD (gastroesophageal reflux disease)    Ovarian cyst    Seasonal allergies     Past Surgical History:  Procedure Laterality Date   FACIAL FRACTURE SURGERY      Family History  Problem Relation Age of Onset   Heart disease Mother    Mitral valve  prolapse Mother    Heart disease Father    Osteoarthritis Father    Hypertension Father    GER disease Father    Cancer Paternal Grandmother        leukemia     Social History[1]  Medications Ordered Prior to Encounter[2]   Review of Systems  Constitutional:  Positive for fatigue. Negative for fever.  HENT:  Positive for congestion. Negative for ear pain and sore throat.   Eyes: Negative.   Respiratory: Negative.    Cardiovascular: Negative.   Gastrointestinal: Negative.   Genitourinary: Negative.   Musculoskeletal:  Positive for arthralgias (right wrist, fingers).  Skin: Negative.    Neurological:  Positive for dizziness (with sinus infections). Negative for headaches.  Psychiatric/Behavioral: Negative.         Objective:    BP 122/78 (BP Location: Left Arm, Patient Position: Sitting, Cuff Size: Normal)   Pulse 88   Temp (!) 97.5 F (36.4 C)   Ht 5' 2 (1.575 m)   Wt 160 lb 3.2 oz (72.7 kg)   SpO2 99%   BMI 29.30 kg/m   Wt Readings from Last 3 Encounters:  12/10/24 160 lb 3.2 oz (72.7 kg)    BP Readings from Last 3 Encounters:  12/10/24 122/78  04/24/22 132/86  02/25/21 (!) 145/85    Physical Exam Vitals and nursing note reviewed.  Constitutional:      General: She is not in acute distress.    Appearance: Normal appearance.  HENT:     Head: Normocephalic and atraumatic.     Right Ear: Tympanic membrane, ear canal and external ear normal.     Left Ear: Tympanic membrane, ear canal and external ear normal.     Nose: Congestion present.     Mouth/Throat:     Pharynx: No posterior oropharyngeal erythema.  Eyes:     Conjunctiva/sclera: Conjunctivae normal.  Cardiovascular:     Rate and Rhythm: Normal rate and regular rhythm.     Pulses: Normal pulses.     Heart sounds: Normal heart sounds.  Pulmonary:     Effort: Pulmonary effort is normal.     Breath sounds: Normal breath sounds.  Abdominal:     Palpations: Abdomen is soft.     Tenderness: There is no abdominal tenderness.  Musculoskeletal:        General: Normal range of motion.     Cervical back: Normal range of motion and neck supple.     Right lower leg: No edema.     Left lower leg: No edema.  Lymphadenopathy:     Cervical: No cervical adenopathy.  Skin:    General: Skin is warm and dry.  Neurological:     General: No focal deficit present.     Mental Status: She is alert and oriented to person, place, and time.     Cranial Nerves: No cranial nerve deficit.     Coordination: Coordination normal.     Gait: Gait normal.  Psychiatric:        Mood and Affect: Mood normal.         Behavior: Behavior normal.        Thought Content: Thought content normal.        Judgment: Judgment normal.        Assessment & Plan:   Problem List Items Addressed This Visit       Respiratory   Mild intermittent asthma without complication - Primary   Chronic, stable. Her allergy-induced asthma is controlled with  loratadine and albuterol. She rarely uses albuterol and has had no recent exacerbations. Continue loratadine and albuterol inhaler as needed. 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 She is overdue for multiple vaccines and will discuss more next visit.      Frequent sinus infections   She experiences sinusitis with seasonal allergies about 2-3 times per year. Vertigo occurs with sinus infections, sometimes requiring physical therapy. Nasal rinses with distilled or boiled water are recommended. She is advised to alternate allergy medications (Claritin, Zyrtec, Xyzal ) and monitor symptoms, contacting if sinusitis worsens. Check IgA today.       Relevant Medications   loratadine (CLARITIN REDITABS) 10 MG dissolvable tablet   Other Relevant Orders   IGA     Digestive   Gastroesophageal reflux disease   Chronic, stable. Her GERD is managed with zegerid and dietary modifications, and symptoms are well-controlled. Continue current regimen.       Relevant Medications   Omeprazole-Sodium Bicarbonate (ZEGERID OTC PO)     Nervous and Auditory   Carpal tunnel syndrome of right wrist   Her right-sided carpal tunnel syndrome symptoms are worsening. Previous therapy provided some relief. She uses a compression glove and wrist brace. Referred to an orthopedic specialist for further evaluation and management.      Relevant Orders   Ambulatory referral to Orthopedic Surgery     Other   Environmental and seasonal allergies   Chronic, stable. Continue loratadine 10mg  daily, can alternate  with zyrtec or xyzal  if needed. Recommend adding nasal sinus rinses daily.       Relevant Orders   IGA   Other Visit Diagnoses       Immunization due       Flu vaccine given today   Relevant Orders   Flu vaccine trivalent PF, 6mos and older(Flulaval,Afluria,Fluarix,Fluzone) (Completed)     Screening for cardiovascular condition       Screen lipid panel today   Relevant Orders   Lipid panel     Fatigue, unspecified type       Check CMP, CBC, TSH, vitamin D, vitamin B12 today. Encourage regular sleep and exercise.   Relevant Orders   CBC with Differential/Platelet   Comprehensive metabolic panel with GFR   Vitamin B12   VITAMIN D 25 Hydroxy (Vit-D Deficiency, Fractures)   TSH     Encounter for screening mammogram for malignant neoplasm of breast       Mammogram ordered today   Relevant Orders   MM 3D SCREENING MAMMOGRAM BILATERAL BREAST      Follow up plan: Return in about 3 months (around 03/10/2025) for CPE.  Tinnie DELENA Harada, NP  I,Emily Lagle,acting as a scribe for [employer], NP.,have documented all relevant documentation on the behalf of Kaylee Karel DELENA Harada, NP.  I, Tinnie DELENA Harada, NP, have reviewed all documentation for this visit. The documentation on 12/10/2024 for the exam, diagnosis, procedures, and orders are all accurate and complete.     [1]  Social History Tobacco Use   Smoking status: Never   Smokeless tobacco: Never  Vaping Use   Vaping status: Never Used  Substance Use Topics   Alcohol use: Not Currently   Drug use: Never  [2]  Current Outpatient Medications on File Prior to Visit  Medication Sig Dispense Refill   albuterol (VENTOLIN HFA) 108 (90 Base) MCG/ACT inhaler Inhale into the lungs every 6 (six) hours as  needed for wheezing or shortness of breath.     loratadine (CLARITIN REDITABS) 10 MG dissolvable tablet Take 10 mg by mouth daily.     Omeprazole-Sodium Bicarbonate (ZEGERID OTC PO) Take by mouth daily.     meclizine  (ANTIVERT ) 12.5  MG tablet Take 1 tablet (12.5 mg total) by mouth 3 (three) times daily as needed for dizziness. (Patient not taking: Reported on 12/10/2024) 30 tablet 0   No current facility-administered medications on file prior to visit.   "

## 2024-12-10 NOTE — Assessment & Plan Note (Signed)
 Her right-sided carpal tunnel syndrome symptoms are worsening. Previous therapy provided some relief. She uses a compression glove and wrist brace. Referred to an orthopedic specialist for further evaluation and management.

## 2024-12-10 NOTE — Assessment & Plan Note (Signed)
 She experiences sinusitis with seasonal allergies about 2-3 times per year. Vertigo occurs with sinus infections, sometimes requiring physical therapy. Nasal rinses with distilled or boiled water are recommended. She is advised to alternate allergy medications (Claritin, Zyrtec, Xyzal ) and monitor symptoms, contacting if sinusitis worsens. Check IgA today.

## 2024-12-11 LAB — IGA: Immunoglobulin A: 276 mg/dL (ref 47–310)

## 2024-12-23 ENCOUNTER — Ambulatory Visit: Admitting: Orthopaedic Surgery

## 2024-12-27 ENCOUNTER — Encounter
